# Patient Record
Sex: Female | Born: 1985 | Race: White | Hispanic: No | Marital: Married | Smoking: Former smoker
Health system: Southern US, Community
[De-identification: ages and names within clinical notes are randomized; demographics above are authoritative.]

## PROBLEM LIST (undated history)

## (undated) DIAGNOSIS — Z789 Other specified health status: Secondary | ICD-10-CM

## (undated) HISTORY — PX: WISDOM TOOTH EXTRACTION: SHX21

---

## 2017-06-13 LAB — OB RESULTS CONSOLE ABO/RH: RH Type: POSITIVE

## 2017-06-13 LAB — OB RESULTS CONSOLE RUBELLA ANTIBODY, IGM: RUBELLA: IMMUNE

## 2017-06-13 LAB — OB RESULTS CONSOLE RPR: RPR: NONREACTIVE

## 2017-06-13 LAB — OB RESULTS CONSOLE ANTIBODY SCREEN: Antibody Screen: NEGATIVE

## 2017-06-13 LAB — OB RESULTS CONSOLE HEPATITIS B SURFACE ANTIGEN: Hepatitis B Surface Ag: NEGATIVE

## 2017-06-13 LAB — OB RESULTS CONSOLE VARICELLA ZOSTER ANTIBODY, IGG: VARICELLA IGG: IMMUNE

## 2017-06-13 LAB — OB RESULTS CONSOLE HIV ANTIBODY (ROUTINE TESTING): HIV: NONREACTIVE

## 2017-06-24 LAB — CYTOLOGY - PAP: Pap: NEGATIVE

## 2017-06-24 LAB — OB RESULTS CONSOLE GC/CHLAMYDIA
CHLAMYDIA, DNA PROBE: NEGATIVE
GC PROBE AMP, GENITAL: NEGATIVE

## 2017-12-02 NOTE — L&D Delivery Note (Signed)
Delivery Note At 12:43 AM a viable female was delivered via  (Presentation: vertex; ROA) over an intact perineum. Head delivered ROA. No nuchal cord present. Shoulder and body delivered in usual fashion. Infant placed on mother's abdomen, dried and bulb suctioned. Cord clamped x 2 after 1-minute delay, and cut by family member. Cord blood drawn. After 1 minute, the cord was clamped and cut. 40 units of pitocin diluted in 1000cc LR was infused rapidly IV.  The placenta separated spontaneously and delivered via CCT and maternal pushing effort.  It was inspected and appears to be intact with a 3 VC. Uterine firmness confirmed after bimanual massage, IM pitocin and methergine.   APGAR: 8,9; weight pending.   Placenta status: intact, to L&D.  Cord: 3V without complications.  Cord pH: not sent.  Anesthesia:  n/a Episiotomy:  n/a Lacerations:  Superficial perineal Suture Repair: n/a Est. Blood Loss (mL): 300   Mom to postpartum.  Baby to Couplet care / Skin to Skin.  Haley Kline 12/31/2017, 1:07 AM

## 2017-12-12 ENCOUNTER — Encounter: Payer: Self-pay | Admitting: *Deleted

## 2017-12-23 ENCOUNTER — Ambulatory Visit (INDEPENDENT_AMBULATORY_CARE_PROVIDER_SITE_OTHER): Payer: No Typology Code available for payment source | Admitting: Student

## 2017-12-23 ENCOUNTER — Encounter (HOSPITAL_COMMUNITY): Payer: Self-pay

## 2017-12-23 ENCOUNTER — Ambulatory Visit (HOSPITAL_COMMUNITY)
Admission: RE | Admit: 2017-12-23 | Discharge: 2017-12-23 | Disposition: A | Discharge status: Home / Self Care | Payer: No Typology Code available for payment source | Source: Ambulatory Visit | Attending: Student | Admitting: Student

## 2017-12-23 ENCOUNTER — Other Ambulatory Visit: Payer: Self-pay | Admitting: Student

## 2017-12-23 ENCOUNTER — Other Ambulatory Visit (HOSPITAL_COMMUNITY)
Admission: RE | Admit: 2017-12-23 | Discharge: 2017-12-23 | Disposition: A | Discharge status: Home / Self Care | Payer: No Typology Code available for payment source | Source: Ambulatory Visit | Attending: Student | Admitting: Student

## 2017-12-23 ENCOUNTER — Encounter: Payer: Self-pay | Admitting: Student

## 2017-12-23 VITALS — BP 111/67 | HR 91 | Ht 66.0 in | Wt 218.6 lb

## 2017-12-23 DIAGNOSIS — Z3A39 39 weeks gestation of pregnancy: Secondary | ICD-10-CM | POA: Diagnosis not present

## 2017-12-23 DIAGNOSIS — L989 Disorder of the skin and subcutaneous tissue, unspecified: Secondary | ICD-10-CM | POA: Diagnosis not present

## 2017-12-23 DIAGNOSIS — Z3689 Encounter for other specified antenatal screening: Secondary | ICD-10-CM | POA: Diagnosis not present

## 2017-12-23 DIAGNOSIS — O09293 Supervision of pregnancy with other poor reproductive or obstetric history, third trimester: Secondary | ICD-10-CM

## 2017-12-23 DIAGNOSIS — Z3480 Encounter for supervision of other normal pregnancy, unspecified trimester: Secondary | ICD-10-CM

## 2017-12-23 DIAGNOSIS — O99213 Obesity complicating pregnancy, third trimester: Secondary | ICD-10-CM

## 2017-12-23 DIAGNOSIS — Z23 Encounter for immunization: Secondary | ICD-10-CM | POA: Diagnosis not present

## 2017-12-23 DIAGNOSIS — O0933 Supervision of pregnancy with insufficient antenatal care, third trimester: Secondary | ICD-10-CM | POA: Insufficient documentation

## 2017-12-23 DIAGNOSIS — Z3483 Encounter for supervision of other normal pregnancy, third trimester: Secondary | ICD-10-CM | POA: Insufficient documentation

## 2017-12-23 DIAGNOSIS — O26843 Uterine size-date discrepancy, third trimester: Secondary | ICD-10-CM

## 2017-12-23 DIAGNOSIS — E669 Obesity, unspecified: Secondary | ICD-10-CM | POA: Diagnosis not present

## 2017-12-23 HISTORY — DX: Other specified health status: Z78.9

## 2017-12-23 LAB — POCT URINALYSIS DIP (DEVICE)
BILIRUBIN URINE: NEGATIVE
Glucose, UA: NEGATIVE mg/dL
HGB URINE DIPSTICK: NEGATIVE
KETONES UR: NEGATIVE mg/dL
NITRITE: NEGATIVE
PH: 7 (ref 5.0–8.0)
PROTEIN: NEGATIVE mg/dL
Specific Gravity, Urine: 1.015 (ref 1.005–1.030)
Urobilinogen, UA: 0.2 mg/dL (ref 0.0–1.0)

## 2017-12-23 LAB — GLUCOSE, CAPILLARY: Glucose-Capillary: 63 mg/dL — ABNORMAL LOW (ref 65–99)

## 2017-12-23 LAB — OB RESULTS CONSOLE GBS: GBS: POSITIVE

## 2017-12-23 MED ORDER — MUPIROCIN CALCIUM 2 % EX CREA: 1.0000 "application " | TOPICAL_CREAM | Freq: Three times a day (TID) | CUTANEOUS | 0 refills | Status: DC

## 2017-12-23 NOTE — Patient Instructions (Addendum)
Contraception Choices Contraception, also called birth control, refers to methods or devices that prevent pregnancy. Hormonal methods Contraceptive implant A contraceptive implant is a thin, plastic tube that contains a hormone. It is inserted into the upper part of the arm. It can remain in place for up to 3 years. Progestin-only injections Progestin-only injections are injections of progestin, a synthetic form of the hormone progesterone. They are given every 3 months by a health care provider. Birth control pills Birth control pills are pills that contain hormones that prevent pregnancy. They must be taken once a day, preferably at the same time each day. Birth control patch The birth control patch contains hormones that prevent pregnancy. It is placed on the skin and must be changed once a week for three weeks and removed on the fourth week. A prescription is needed to use this method of contraception. Vaginal ring A vaginal ring contains hormones that prevent pregnancy. It is placed in the vagina for three weeks and removed on the fourth week. After that, the process is repeated with a new ring. A prescription is needed to use this method of contraception. Emergency contraceptive Emergency contraceptives prevent pregnancy after unprotected sex. They come in pill form and can be taken up to 5 days after sex. They work best the sooner they are taken after having sex. Most emergency contraceptives are available without a prescription. This method should not be used as your only form of birth control. Barrier methods Female condom A female condom is a thin sheath that is worn over the penis during sex. Condoms keep sperm from going inside a woman's body. They can be used with a spermicide to increase their effectiveness. They should be disposed after a single use. Female condom A female condom is a soft, loose-fitting sheath that is put into the vagina before sex. The condom keeps sperm from going  inside a woman's body. They should be disposed after a single use. Diaphragm A diaphragm is a soft, dome-shaped barrier. It is inserted into the vagina before sex, along with a spermicide. The diaphragm blocks sperm from entering the uterus, and the spermicide kills sperm. A diaphragm should be left in the vagina for 6-8 hours after sex and removed within 24 hours. A diaphragm is prescribed and fitted by a health care provider. A diaphragm should be replaced every 1-2 years, after giving birth, after gaining more than 15 lb (6.8 kg), and after pelvic surgery. Cervical cap A cervical cap is a round, soft latex or plastic cup that fits over the cervix. It is inserted into the vagina before sex, along with spermicide. It blocks sperm from entering the uterus. The cap should be left in place for 6-8 hours after sex and removed within 48 hours. A cervical cap must be prescribed and fitted by a health care provider. It should be replaced every 2 years. Sponge A sponge is a soft, circular piece of polyurethane foam with spermicide on it. The sponge helps block sperm from entering the uterus, and the spermicide kills sperm. To use it, you make it wet and then insert it into the vagina. It should be inserted before sex, left in for at least 6 hours after sex, and removed and thrown away within 30 hours. Spermicides Spermicides are chemicals that kill or block sperm from entering the cervix and uterus. They can come as a cream, jelly, suppository, foam, or tablet. A spermicide should be inserted into the vagina with an applicator at least 10-15 minutes before   sex to allow time for it to work. The process must be repeated every time you have sex. Spermicides do not require a prescription. Intrauterine contraception Intrauterine device (IUD) An IUD is a T-shaped device that is put in a woman's uterus. There are two types:  Hormone IUD.This type contains progestin, a synthetic form of the hormone progesterone. This  type can stay in place for 3-5 years.  Copper IUD.This type is wrapped in copper wire. It can stay in place for 10 years.  Permanent methods of contraception Female tubal ligation In this method, a woman's fallopian tubes are sealed, tied, or blocked during surgery to prevent eggs from traveling to the uterus. Hysteroscopic sterilization In this method, a small, flexible insert is placed into each fallopian tube. The inserts cause scar tissue to form in the fallopian tubes and block them, so sperm cannot reach an egg. The procedure takes about 3 months to be effective. Another form of birth control must be used during those 3 months. Female sterilization This is a procedure to tie off the tubes that carry sperm (vasectomy). After the procedure, the man can still ejaculate fluid (semen). Natural planning methods Natural family planning In this method, a couple does not have sex on days when the woman could become pregnant. Calendar method This means keeping track of the length of each menstrual cycle, identifying the days when pregnancy can happen, and not having sex on those days. Ovulation method In this method, a couple avoids sex during ovulation. Symptothermal method This method involves not having sex during ovulation. The woman typically checks for ovulation by watching changes in her temperature and in the consistency of cervical mucus. Post-ovulation method In this method, a couple waits to have sex until after ovulation. Summary  Contraception, also called birth control, means methods or devices that prevent pregnancy.  Hormonal methods of contraception include implants, injections, pills, patches, vaginal rings, and emergency contraceptives.  Barrier methods of contraception can include female condoms, female condoms, diaphragms, cervical caps, sponges, and spermicides.  There are two types of IUDs (intrauterine devices). An IUD can be put in a woman's uterus to prevent pregnancy  for 3-5 years.  Permanent sterilization can be done through a procedure for males, females, or both.  Natural family planning methods involve not having sex on days when the woman could become pregnant. This information is not intended to replace advice given to you by your health care provider. Make sure you discuss any questions you have with your health care provider. Document Released: 11/18/2005 Document Revised: 12/21/2016 Document Reviewed: 12/21/2016 Elsevier Interactive Patient Education  2018 [employer].     Perinatal Depression When a woman feels excessive sadness, anger, or anxiety during pregnancy or during the first 12 months after she gives birth, she has a condition called perinatal depression. Depression can interfere with work, school, relationships, and other everyday activities. If it is not managed properly, it can also cause problems in the mother and her baby. Sometimes, perinatal depression is left untreated because symptoms are thought to be normal mood swings during and right after pregnancy. If you have symptoms of depression, it is important to talk with your health care provider. What are the causes? The exact cause of this condition is not known. Hormonal changes during and after pregnancy may play a role in causing perinatal depression. What increases the risk? You are more likely to develop this condition if:  You have a personal or family history of depression, anxiety, or mood  disorders.  You experience a stressful life event during pregnancy, such as the death of a loved one.  You have a lot of regular life stress.  You do not have support from family members or loved ones, or you are in an abusive relationship.  What are the signs or symptoms? Symptoms of this condition include:  Feeling sad or hopeless.  Feelings of guilt.  Feeling irritable or overwhelmed.  Changes in your appetite.  Lack of energy or motivation.  Sleep  problems.  Difficulty concentrating or completing tasks.  Loss of interest in hobbies or relationships.  Headaches or stomach problems that do not go away.  How is this diagnosed? This condition is diagnosed based on a physical exam and mental evaluation. In some cases, your health care provider may use a depression screening tool. These tools include a list of questions that can help a health care provider diagnose depression. Your health care provider may refer you to a mental health expert who specializes in depression. How is this treated? This condition may be treated with:  Medicines. Your health care provider will only give you medicines that have been proven safe for pregnancy and breastfeeding.  Talk therapy with a mental health professional to help change your patterns of thinking (cognitive behavioral therapy).  Support groups.  Brain stimulation or light therapies.  Stress reduction therapies, such as mindfulness.  Follow these instructions at home: Lifestyle  Do not use any products that contain nicotine or tobacco, such as cigarettes and e-cigarettes. If you need help quitting, ask your health care provider.  Do not use alcohol when you are pregnant. After your baby is born, limit alcohol intake to no more than 1 drink a day. One drink equals 12 oz of beer, 5 oz of wine, or 1 oz of hard liquor.  Consider joining a support group for new mothers. Ask your health care provider for recommendations.  Take good care of yourself. Make sure you: ? Get plenty of sleep. If you are having trouble sleeping, talk with your health care provider. ? Eat a healthy diet. This includes plenty of fruits and vegetables, whole grains, and lean proteins. ? Exercise regularly, as told by your health care provider. Ask your health care provider what exercises are safe for you. General instructions  Take over-the-counter and prescription medicines only as told by your health care  provider.  Talk with your partner or family members about your feelings during pregnancy. Share any concerns or anxieties that you may have.  Ask for help with tasks or chores when you need it. Ask friends and family members to provide meals, watch your children, or help with cleaning.  Keep all follow-up visits as told by your health care provider. This is important. Contact a health care provider if:  You (or people close to you) notice that you have any symptoms of depression.  You have depression and your symptoms get worse.  You experience side effects from medicines, such as nausea or sleep problems. Get help right away if:  You feel like hurting yourself, your baby, or someone else. If you ever feel like you may hurt yourself or others, or have thoughts about taking your own life, get help right away. You can go to your nearest emergency department or call:  Your local emergency services (911 in the [place]  A suicide crisis helpline, such as the National Suicide Prevention Lifeline at 603-816-6962. This is open 24 hours a day.  Summary  Perinatal depression  is when a woman feels excessive sadness, anger, or anxiety during pregnancy or during the first 12 months after she gives birth.  If perinatal depression is not treated, it can lead to health problems for the mother and her baby.  This condition is treated with medicines, talk therapy, stress reduction therapies, or a combination of two or more treatments.  Talk with your partner or family members about your feelings. Do not be afraid to ask for help. This information is not intended to replace advice given to you by your health care provider. Make sure you discuss any questions you have with your health care provider. Document Released: 01/15/2017 Document Revised: 01/15/2017 Document Reviewed: 01/15/2017 Elsevier Interactive Patient Education  [employer]2018 Elsevier Inc.

## 2017-12-23 NOTE — Progress Notes (Signed)
Subjective:    Haley Kline is a [id number] 5765w0d being seen today for her first obstetrical visit.  Her obstetrical history is significant for limited prenatal care & hx of macrosomia.. Patient does intend to breast feed. Pregnancy history fully reviewed. [facility] was [facility] [place] [facility] but has not been seen since 07/2017. She moved to [place] last month. Denies complications with previous deliveries or pregnancies. Has history of macrosomia -- last baby weighed 9 lb 8oz -- delivered naturally. Patient reports mood swings & feeling depressed at time over the last few weeks. Denies history of diagnosed depression but does reports some "PP blues" after previous deliveries that did not require intervention or medication. Currently denies SI/HI. States she has good support at home with her husband & her mother who lives near by. Recommended Asher MuirJamie CSW to patient -- states she will consider talking to her in the future or if symptoms don't improve/worsen but does not want to speak with her today.   Patient reports no complaints.  Vitals:   12/23/17 0907 12/23/17 0908  BP: 111/67   Pulse: 91   Weight: 218 lb 9.6 oz (99.2 kg)   Height:  5\' 6"  (1.676 m)    HISTORY: OB History  Gravida Para Term Preterm AB Living  3 2 2     2   SAB TAB Ectopic Multiple Live Births          2    # Outcome Date GA Lbr Len/2nd Weight Sex Delivery Anes PTL Lv  3 Current           2 Term 04/28/16 7024w3d  9 lb 8 oz (4.309 kg) M Vag-Spont None N LIV  1 Term 10/14/14 5524w3d  8 lb 8 oz (3.856 kg) M  EPI N LIV     History reviewed. No pertinent past medical history. History reviewed. No pertinent surgical history. History reviewed. No pertinent family history.   Exam    Uterus:  Fundal Height: 41 cm  Pelvic Exam:    Vagina:  GC/CT collected   Cervix: 1.5/50/-2   Skin: Multiple lesions on bilateral inner thighs -- crusted over, erythematous surrounding scabs. No lesions.    Cardiovascular: regular rate and rhythm, no murmurs or gallops   Respiratory:  appears well, vitals normal, no respiratory distress, acyanotic, normal RR, chest clear, no wheezing, crepitations, rhonchi, normal symmetric air entry   Abdomen: soft, non-tender; bowel sounds normal; no masses,  no organomegaly      Assessment:    Pregnancy: [id number] Patient Active Problem List   Diagnosis Date Noted  . Supervision of other normal pregnancy, antepartum 12/23/2017  . Limited prenatal care in third trimester 12/23/2017  . Hx of macrosomia in infant in prior pregnancy, currently pregnant, third trimester 12/23/2017  . Uterine size date discrepancy pregnancy, third trimester 12/23/2017        Plan:   1. Supervision of other normal pregnancy, antepartum  - Culture, beta strep (group b only) - GC/Chlamydia probe amp ([place])not at [facility] - Flu Vaccine QUAD 36+ mos IM - Tdap vaccine greater than or equal to 7yo IM - RPR - CBC - HIV antibody (with reflex) - Culture, OB Urine  2. Limited prenatal care in third trimester - not fasting today for gtt -- random CBG = 63  3. Skin lesion  - mupirocin cream (BACTROBAN) 2 %; Apply 1 application topically 3 (three) times daily.  Dispense: 30 g; Refill: 0  4. Hx of macrosomia in  infant in prior pregnancy, currently pregnant, third trimester -Fundal height today 41 cm. U/s for growth scheduled for this afternoon - [place] MFM OB DETAIL +14 WK; Future  5. Uterine size date discrepancy pregnancy, third trimester  - [place] MFM OB DETAIL +14 WK; Future    Judeth Horn 12/23/2017

## 2017-12-24 ENCOUNTER — Telehealth: Payer: Self-pay | Admitting: General Practice

## 2017-12-24 LAB — CBC
Hematocrit: 38.3 % (ref 34.0–46.6)
Hemoglobin: 12.7 g/dL (ref 11.1–15.9)
MCH: 27.9 pg (ref 26.6–33.0)
MCHC: 33.2 g/dL (ref 31.5–35.7)
MCV: 84 fL (ref 79–97)
PLATELETS: 250 10*3/uL (ref 150–379)
RBC: 4.55 x10E6/uL (ref 3.77–5.28)
RDW: 13.6 % (ref 12.3–15.4)
WBC: 6.8 10*3/uL (ref 3.4–10.8)

## 2017-12-24 LAB — GC/CHLAMYDIA PROBE AMP (~~LOC~~) NOT AT ARMC
CHLAMYDIA, DNA PROBE: NEGATIVE
NEISSERIA GONORRHEA: NEGATIVE

## 2017-12-24 LAB — RPR: RPR: NONREACTIVE

## 2017-12-24 LAB — HIV ANTIBODY (ROUTINE TESTING W REFLEX): HIV SCREEN 4TH GENERATION: NONREACTIVE

## 2017-12-24 NOTE — Telephone Encounter (Signed)
Patient called and left message on nurse line stating she is returning a call for results. Per chart review, cannot see where call was placed. Called patient and she states maybe it was regarding the ultrasound because they were concerned about baby's growth. Reviewed ultrasound with patient. Told patient I will send Denny Peonrin a message as maybe she tried to call her. Patient verbalized understanding and had no questions

## 2017-12-26 LAB — CULTURE, BETA STREP (GROUP B ONLY): Strep Gp B Culture: POSITIVE — AB

## 2017-12-27 ENCOUNTER — Encounter: Payer: Self-pay | Admitting: Student

## 2017-12-27 DIAGNOSIS — B951 Streptococcus, group B, as the cause of diseases classified elsewhere: Secondary | ICD-10-CM | POA: Insufficient documentation

## 2017-12-29 LAB — URINE CULTURE, OB REFLEX

## 2017-12-29 LAB — CULTURE, OB URINE

## 2017-12-30 ENCOUNTER — Encounter (HOSPITAL_COMMUNITY): Payer: Self-pay

## 2017-12-30 ENCOUNTER — Other Ambulatory Visit: Payer: Self-pay

## 2017-12-30 ENCOUNTER — Other Ambulatory Visit: Payer: Self-pay | Admitting: Advanced Practice Midwife

## 2017-12-30 ENCOUNTER — Inpatient Hospital Stay (HOSPITAL_COMMUNITY)
Admission: AD | Admit: 2017-12-30 | Discharge: 2018-01-02 | DRG: 807 | Disposition: A | Discharge status: Home / Self Care | Payer: No Typology Code available for payment source | Source: Ambulatory Visit | Attending: Obstetrics and Gynecology | Admitting: Obstetrics and Gynecology

## 2017-12-30 ENCOUNTER — Telehealth (HOSPITAL_COMMUNITY): Payer: Self-pay | Admitting: *Deleted

## 2017-12-30 ENCOUNTER — Ambulatory Visit (INDEPENDENT_AMBULATORY_CARE_PROVIDER_SITE_OTHER): Payer: No Typology Code available for payment source | Admitting: Advanced Practice Midwife

## 2017-12-30 VITALS — BP 113/85 | HR 101 | Wt 219.5 lb

## 2017-12-30 DIAGNOSIS — O26843 Uterine size-date discrepancy, third trimester: Secondary | ICD-10-CM | POA: Diagnosis present

## 2017-12-30 DIAGNOSIS — B951 Streptococcus, group B, as the cause of diseases classified elsewhere: Secondary | ICD-10-CM | POA: Diagnosis present

## 2017-12-30 DIAGNOSIS — Z3A40 40 weeks gestation of pregnancy: Secondary | ICD-10-CM | POA: Diagnosis not present

## 2017-12-30 DIAGNOSIS — Z3483 Encounter for supervision of other normal pregnancy, third trimester: Secondary | ICD-10-CM | POA: Diagnosis present

## 2017-12-30 DIAGNOSIS — O99824 Streptococcus B carrier state complicating childbirth: Secondary | ICD-10-CM | POA: Diagnosis present

## 2017-12-30 DIAGNOSIS — Z87891 Personal history of nicotine dependence: Secondary | ICD-10-CM

## 2017-12-30 DIAGNOSIS — O3663X0 Maternal care for excessive fetal growth, third trimester, not applicable or unspecified: Secondary | ICD-10-CM

## 2017-12-30 DIAGNOSIS — O09293 Supervision of pregnancy with other poor reproductive or obstetric history, third trimester: Secondary | ICD-10-CM | POA: Diagnosis not present

## 2017-12-30 DIAGNOSIS — O3660X0 Maternal care for excessive fetal growth, unspecified trimester, not applicable or unspecified: Secondary | ICD-10-CM

## 2017-12-30 DIAGNOSIS — Z3480 Encounter for supervision of other normal pregnancy, unspecified trimester: Secondary | ICD-10-CM

## 2017-12-30 DIAGNOSIS — O0933 Supervision of pregnancy with insufficient antenatal care, third trimester: Secondary | ICD-10-CM

## 2017-12-30 MED ORDER — ONDANSETRON HCL 4 MG/2ML IJ SOLN: 4.0000 mg | Freq: Four times a day (QID) | INTRAMUSCULAR | Status: DC | PRN

## 2017-12-30 MED ORDER — PHENYLEPHRINE 40 MCG/ML (10ML) SYRINGE FOR IV PUSH (FOR BLOOD PRESSURE SUPPORT)
80.0000 ug | PREFILLED_SYRINGE | INTRAVENOUS | Status: DC | PRN
  Filled 2017-12-30: qty 5
  Filled 2017-12-30: qty 10

## 2017-12-30 MED ORDER — SOD CITRATE-CITRIC ACID 500-334 MG/5ML PO SOLN: 30.0000 mL | ORAL | Status: DC | PRN

## 2017-12-30 MED ORDER — EPHEDRINE 5 MG/ML INJ
10.0000 mg | INTRAVENOUS | Status: DC | PRN
  Filled 2017-12-30: qty 2

## 2017-12-30 MED ORDER — PENICILLIN G POTASSIUM 5000000 UNITS IJ SOLR
5.0000 10*6.[IU] | Freq: Once | INTRAMUSCULAR | Status: DC
  Filled 2017-12-30: qty 5

## 2017-12-30 MED ORDER — LIDOCAINE HCL (PF) 1 % IJ SOLN
30.0000 mL | INTRAMUSCULAR | Status: DC | PRN
  Filled 2017-12-30: qty 30

## 2017-12-30 MED ORDER — ACETAMINOPHEN 325 MG PO TABS: 650.0000 mg | ORAL_TABLET | ORAL | Status: DC | PRN

## 2017-12-30 MED ORDER — PHENYLEPHRINE 40 MCG/ML (10ML) SYRINGE FOR IV PUSH (FOR BLOOD PRESSURE SUPPORT)
80.0000 ug | PREFILLED_SYRINGE | INTRAVENOUS | Status: DC | PRN
  Filled 2017-12-30: qty 5

## 2017-12-30 MED ORDER — DIPHENHYDRAMINE HCL 50 MG/ML IJ SOLN: 12.5000 mg | INTRAMUSCULAR | Status: DC | PRN

## 2017-12-30 MED ORDER — OXYTOCIN BOLUS FROM INFUSION: 500.0000 mL | Freq: Once | INTRAVENOUS | Status: DC

## 2017-12-30 MED ORDER — LACTATED RINGERS IV SOLN: 500.0000 mL | Freq: Once | INTRAVENOUS | Status: DC

## 2017-12-30 MED ORDER — OXYCODONE-ACETAMINOPHEN 5-325 MG PO TABS: 2.0000 | ORAL_TABLET | ORAL | Status: DC | PRN

## 2017-12-30 MED ORDER — FENTANYL 2.5 MCG/ML BUPIVACAINE 1/10 % EPIDURAL INFUSION (WH - ANES)
14.0000 mL/h | INTRAMUSCULAR | Status: DC | PRN
  Filled 2017-12-30: qty 100

## 2017-12-30 MED ORDER — FLEET ENEMA 7-19 GM/118ML RE ENEM: 1.0000 | ENEMA | RECTAL | Status: DC | PRN

## 2017-12-30 MED ORDER — OXYTOCIN 40 UNITS IN LACTATED RINGERS INFUSION - SIMPLE MED
2.5000 [IU]/h | INTRAVENOUS | Status: DC
  Filled 2017-12-30: qty 1000

## 2017-12-30 MED ORDER — LACTATED RINGERS IV SOLN: 500.0000 mL | INTRAVENOUS | Status: DC | PRN

## 2017-12-30 MED ORDER — PENICILLIN G POT IN DEXTROSE 60000 UNIT/ML IV SOLN
3.0000 10*6.[IU] | INTRAVENOUS | Status: DC
  Filled 2017-12-30 (×3): qty 50

## 2017-12-30 MED ORDER — OXYCODONE-ACETAMINOPHEN 5-325 MG PO TABS: 1.0000 | ORAL_TABLET | ORAL | Status: DC | PRN

## 2017-12-30 MED ORDER — LACTATED RINGERS IV SOLN
INTRAVENOUS | Status: DC
  Administered 2017-12-31: 01:00:00 via INTRAVENOUS

## 2017-12-30 NOTE — H&P (Signed)
Haley Kline is a 32 y.o. G3P2 female at 5913w0d gestation presenting for SOL. EFW >90% on 12/23/17.   Patient has received limited prenatal care, last visit was 08/01/17. Recently moved to [place]. [facility]. Membrane sweep morning of 1/29. Denies any vaginal bleeding.   Hx of macrosomia with previous pregnancy. Baby was 9 lb 8 oz. Otherwise pregnancy has been uncomplicated.   Prenatal care: [facility] care at [facility]   OB History    Gravida Para Term Preterm AB Living   3 2 2     2    SAB TAB Ectopic Multiple Live Births           2     Past Medical History:  Diagnosis Date  . Medical history non-contributory    Past Surgical History:  Procedure Laterality Date  . WISDOM TOOTH EXTRACTION     Family History: family history is not on file. Social History:  reports that she quit smoking about 4 years ago. Her smoking use included cigars. she has never used smokeless tobacco. She reports that she does not drink alcohol or use drugs.     Maternal Diabetes: No Genetic Screening: Declined Maternal Ultrasounds/Referrals: Normal Fetal Ultrasounds or other Referrals:  None Maternal Substance Abuse:  No Significant Maternal Medications:  None Significant Maternal Lab Results:  None Other Comments:  None  Review of Systems  Constitutional: Negative for fever.  HENT: Negative.   Respiratory: Negative for shortness of breath.   Cardiovascular: Negative for chest pain.  Gastrointestinal: Positive for constipation and nausea. Negative for abdominal pain, diarrhea and vomiting.  Neurological: Negative for headaches.   Maternal Medical History:  Reason for admission: Nausea.     Dilation: 6 Effacement (%): 90 Station: +1 Exam by:: Zenia ResidesNikki Risheq, RN  Blood pressure 106/74, pulse 99, temperature (!) 97.5 F (36.4 C), temperature source Oral, resp. rate 18, weight 222 lb 12 oz (101 kg), last menstrual period 03/25/2017, SpO2  100 %. Exam Physical Exam  Nursing note and vitals reviewed. Constitutional: She is oriented to person, place, and time. She appears well-developed and well-nourished. No distress.  HENT:  Head: Normocephalic and atraumatic.  Neck: Normal range of motion.  GI: Soft. There is tenderness. There is no rebound.  Neurological: She is alert and oriented to person, place, and time.  Skin: Skin is warm and dry.    Prenatal labs: ABO, Rh: --/--/O POS (01/29 2357) Antibody: NEG (01/29 2357) Rubella: Immune (07/13 1435) RPR: Non Reactive (01/22 1018)  HBsAg: Negative (07/13 1435)  HIV: Non Reactive (01/22 1018)  GBS: Positive (01/22 0000)  GC/Chlamydia: negative (12/23/17) 1-hr glucola: 123 Genetic screening:  n/a Anatomy US: normal anatomy at 18 week  Assessment/Plan: 32 y.o. G3P2 female at 4213w0d gestation in SOL.   Labor: Expectant management  FWB: Cat I Pain: per patient request ID: GBS positive, no penicillin given  MOF: Breastfeeding MOC: undecided. Past two pregnancies with POP.  Circ: N/A, girl     Frederik PearJulie P Lakeva Hollon, MD 12/31/2017

## 2017-12-30 NOTE — MAU Note (Signed)
Pt presents with contraction stating they are 3.5 minutes apart lasting 45 seconds. She is rating the pain 8/10 intermittent.   Pt reports good fetal movement. Denies LOF

## 2017-12-30 NOTE — Telephone Encounter (Signed)
Preadmission screen  

## 2017-12-30 NOTE — Progress Notes (Signed)
   PRENATAL VISIT NOTE  Subjective:  Haley Kline is a 32 y.o. G3P2002 at 197w0d being seen today for ongoing prenatal care.  She is currently monitored for the following issues for this low-risk pregnancy and has Supervision of other normal pregnancy, antepartum; Limited prenatal care in third trimester; Hx of macrosomia in infant in prior pregnancy, currently pregnant, third trimester; Uterine size date discrepancy pregnancy, third trimester; and Positive GBS test on their problem list.  Patient reports backache.  Contractions: Regular. Vag. Bleeding: Bloody Show.  Movement: Present. Denies leaking of fluid.   The following portions of the patient's history were reviewed and updated as appropriate: allergies, current medications, past family history, past medical history, past social history, past surgical history and problem list. Problem list updated.  Objective:   Vitals:   12/30/17 1010  BP: 113/85  Pulse: (!) 101  Weight: 219 lb 8 oz (99.6 kg)    Fetal Status: Fetal Heart Rate (bpm): 155   Movement: Present  Presentation: Vertex  General:  Alert, oriented and cooperative. Patient is in no acute distress.  Skin: Skin is warm and dry. No rash noted.   Cardiovascular: Normal heart rate noted  Respiratory: Normal respiratory effort, no problems with respiration noted  Abdomen: Soft, gravid, appropriate for gestational age.  Pain/Pressure: Present     Pelvic: Cervical exam performed Dilation: 2 Effacement (%): 70 Station: 0 Membranes swept today.   Extremities: Normal range of motion.  Edema: Trace  Mental Status:  Normal mood and affect. Normal behavior. Normal judgment and thought content.   Assessment and Plan:  Pregnancy: G3P2002 at 2197w0d  1. Supervision of other normal pregnancy, antepartum   2. Hx of macrosomia in infant in prior pregnancy, currently pregnant, third trimester   3. Excessive fetal growth affecting management of pregnancy in third trimester, single or  unspecified fetus    Consult with Dr. Adrian BlackwaterStinson, schedule IOL for tomorrow 12/31/17 Spoke with Labor and delivery 0730 available. Orders placed.    Term labor symptoms and general obstetric precautions including but not limited to vaginal bleeding, contractions, leaking of fluid and fetal movement were reviewed in detail with the patient. Please refer to After Visit Summary for other counseling recommendations.  No Follow-up on file.   Thressa ShellerHeather Tyshia Fenter, CNM

## 2017-12-31 ENCOUNTER — Encounter (HOSPITAL_COMMUNITY): Payer: Self-pay

## 2017-12-31 ENCOUNTER — Other Ambulatory Visit: Payer: Self-pay

## 2017-12-31 ENCOUNTER — Inpatient Hospital Stay (HOSPITAL_COMMUNITY): Admission: RE | Admit: 2017-12-31 | Payer: No Typology Code available for payment source | Source: Ambulatory Visit

## 2017-12-31 DIAGNOSIS — O3660X0 Maternal care for excessive fetal growth, unspecified trimester, not applicable or unspecified: Secondary | ICD-10-CM

## 2017-12-31 DIAGNOSIS — O99824 Streptococcus B carrier state complicating childbirth: Secondary | ICD-10-CM

## 2017-12-31 DIAGNOSIS — Z3A40 40 weeks gestation of pregnancy: Secondary | ICD-10-CM

## 2017-12-31 LAB — CBC
HCT: 37.8 % (ref 36.0–46.0)
HEMATOCRIT: 38.1 % (ref 36.0–46.0)
HEMOGLOBIN: 12.9 g/dL (ref 12.0–15.0)
Hemoglobin: 13 g/dL (ref 12.0–15.0)
MCH: 28.4 pg (ref 26.0–34.0)
MCH: 28.8 pg (ref 26.0–34.0)
MCHC: 33.9 g/dL (ref 30.0–36.0)
MCHC: 34.4 g/dL (ref 30.0–36.0)
MCV: 83.6 fL (ref 78.0–100.0)
MCV: 83.9 fL (ref 78.0–100.0)
PLATELETS: 248 10*3/uL (ref 150–400)
Platelets: 252 10*3/uL (ref 150–400)
RBC: 4.52 MIL/uL (ref 3.87–5.11)
RBC: 4.54 MIL/uL (ref 3.87–5.11)
RDW: 13.2 % (ref 11.5–15.5)
RDW: 13.3 % (ref 11.5–15.5)
WBC: 14.9 10*3/uL — AB (ref 4.0–10.5)
WBC: 18.5 10*3/uL — AB (ref 4.0–10.5)

## 2017-12-31 LAB — TYPE AND SCREEN
ABO/RH(D): O POS
ANTIBODY SCREEN: NEGATIVE

## 2017-12-31 LAB — RPR: RPR: NONREACTIVE

## 2017-12-31 LAB — ABO/RH: ABO/RH(D): O POS

## 2017-12-31 MED ORDER — OXYCODONE-ACETAMINOPHEN 5-325 MG PO TABS: 1.0000 | ORAL_TABLET | ORAL | Status: DC | PRN

## 2017-12-31 MED ORDER — BENZOCAINE-MENTHOL 20-0.5 % EX AERO: 1.0000 "application " | INHALATION_SPRAY | CUTANEOUS | Status: DC | PRN

## 2017-12-31 MED ORDER — PRENATAL MULTIVITAMIN CH
1.0000 | ORAL_TABLET | Freq: Every day | ORAL | Status: DC
  Administered 2017-12-31 – 2018-01-01 (×2): 1 via ORAL
  Filled 2017-12-31 (×2): qty 1

## 2017-12-31 MED ORDER — COCONUT OIL OIL: 1.0000 "application " | TOPICAL_OIL | Status: DC | PRN

## 2017-12-31 MED ORDER — ZOLPIDEM TARTRATE 5 MG PO TABS: 5.0000 mg | ORAL_TABLET | Freq: Every evening | ORAL | Status: DC | PRN

## 2017-12-31 MED ORDER — DIPHENHYDRAMINE HCL 25 MG PO CAPS: 25.0000 mg | ORAL_CAPSULE | Freq: Four times a day (QID) | ORAL | Status: DC | PRN

## 2017-12-31 MED ORDER — DIBUCAINE 1 % RE OINT: 1.0000 "application " | TOPICAL_OINTMENT | RECTAL | Status: DC | PRN

## 2017-12-31 MED ORDER — SIMETHICONE 80 MG PO CHEW: 80.0000 mg | CHEWABLE_TABLET | ORAL | Status: DC | PRN

## 2017-12-31 MED ORDER — OXYTOCIN 10 UNIT/ML IJ SOLN
INTRAMUSCULAR | Status: AC
  Administered 2017-12-31: 10 [IU] via INTRAMUSCULAR
  Filled 2017-12-31: qty 1

## 2017-12-31 MED ORDER — OXYCODONE-ACETAMINOPHEN 5-325 MG PO TABS: 2.0000 | ORAL_TABLET | ORAL | Status: DC | PRN

## 2017-12-31 MED ORDER — ACETAMINOPHEN 325 MG PO TABS
650.0000 mg | ORAL_TABLET | ORAL | Status: DC | PRN
  Filled 2017-12-31: qty 2

## 2017-12-31 MED ORDER — TETANUS-DIPHTH-ACELL PERTUSSIS 5-2.5-18.5 LF-MCG/0.5 IM SUSP: 0.5000 mL | Freq: Once | INTRAMUSCULAR | Status: DC

## 2017-12-31 MED ORDER — FENTANYL CITRATE (PF) 100 MCG/2ML IJ SOLN: 100.0000 ug | Freq: Once | INTRAMUSCULAR | Status: DC

## 2017-12-31 MED ORDER — ONDANSETRON HCL 4 MG/2ML IJ SOLN: 4.0000 mg | INTRAMUSCULAR | Status: DC | PRN

## 2017-12-31 MED ORDER — FENTANYL CITRATE (PF) 100 MCG/2ML IJ SOLN
INTRAMUSCULAR | Status: AC
  Administered 2017-12-31: 100 ug via INTRAMUSCULAR
  Filled 2017-12-31: qty 2

## 2017-12-31 MED ORDER — WITCH HAZEL-GLYCERIN EX PADS: 1.0000 "application " | MEDICATED_PAD | CUTANEOUS | Status: DC | PRN

## 2017-12-31 MED ORDER — SENNOSIDES-DOCUSATE SODIUM 8.6-50 MG PO TABS
2.0000 | ORAL_TABLET | ORAL | Status: DC
  Administered 2018-01-01 – 2018-01-02 (×2): 2 via ORAL
  Filled 2017-12-31 (×3): qty 2

## 2017-12-31 MED ORDER — METHYLERGONOVINE MALEATE 0.2 MG/ML IJ SOLN
0.2000 mg | Freq: Once | INTRAMUSCULAR | Status: AC
  Administered 2017-12-31: 0.2 mg via INTRAMUSCULAR

## 2017-12-31 MED ORDER — ONDANSETRON HCL 4 MG PO TABS
4.0000 mg | ORAL_TABLET | ORAL | Status: DC | PRN
Start: 2017-12-31 — End: 2018-01-02

## 2017-12-31 MED ORDER — IBUPROFEN 600 MG PO TABS
600.0000 mg | ORAL_TABLET | Freq: Four times a day (QID) | ORAL | Status: DC
  Administered 2017-12-31 – 2018-01-02 (×8): 600 mg via ORAL
  Filled 2017-12-31 (×9): qty 1

## 2017-12-31 NOTE — Progress Notes (Signed)
CSW received consult due to score 14 on Edinburgh Depression Screen.    When CSW arrived MOB was bonding with infant as evidence by engaging in skin to skin.  MOB appeared comfortable and expressed how breastfeeding with infant was going well.  With MOB's permission, CSW asked MOB's guest to leave the room in effort to meet with MOB in private.   CSW reviewed MOB's response to the EDPS and MOB disclosed that MOB's last week of pregnancy was overwhelming.  MOB also reported that since giving birth, MOB feels better emotionally. MOB shared PPD hx with MOB's oldest child and communicated increase anxiety, isolation, and feeling alone.  MOB also shared that MOB feels that MOB's feeling after first pregnancy was exacerbated by MOB's child being fussy and FOB MH not being stable. MOB denied reaching out for help and reported since then FOB has received medications for his MH needs.   CSW provided education regarding Baby Blues vs PMADs and provided MOB with information about support groups held at [facility].  CSW encouraged MOB to evaluate her mental health throughout the postpartum period with the use of the New Mom Checklist developed by Postpartum Progress and notify a medical professional if symptoms arise. CSW assessed for safety and MOB denied SI, HI, and DV.  MOB presented with insight and awareness and is prepared to reach out to OB provider if a need arises.   CSW identifies no further need for intervention and no barriers to discharge at this time.  Yakima Kreitzer Boyd-Gilyard, MSW, LCSW Clinical Social Work [phone]  

## 2017-12-31 NOTE — Lactation Note (Signed)
This note was copied from a baby's chart. Lactation Consultation Note Baby 5 hrs old. Mom's 3rd baby. Mom BF her other 2 children 13 months each, denies difficulty. [facility]. Mom stated she was experienced and doing fine. Mom putting baby to breast in cradle position swaddled in 2 blankets. Suggested mom unwrap baby to get closer and suggested STS. Mom put the baby to the breast swaddled.  Told mom if she needed anything or had any questions to call. Noted mom writing down I&O for baby. Praised mom. [facility]/LC brochure given w/resources, support groups and LC services.  Patient Name: Haley Kline Today's Date: 12/31/2017 Reason for consult: Initial assessment   Maternal Data Has patient been taught Hand Expression?: Yes Does the patient have breastfeeding experience prior to this delivery?: Yes  Feeding Feeding Type: Breast Fed Length of feed: 15 min  LATCH Score Latch: Grasps breast easily, tongue down, lips flanged, rhythmical sucking.  Audible Swallowing: A few with stimulation  Type of Nipple: Everted at rest and after stimulation  Comfort (Breast/Nipple): Soft / non-tender  Hold (Positioning): No assistance needed to correctly position infant at breast.  LATCH Score: 9  Interventions Interventions: Breast feeding basics reviewed  Lactation Tools Discussed/Used     Consult Status Consult Status: PRN Date: 01/01/18 Follow-up type: In-patient    Charyl DancerCARVER, [place] G 12/31/2017, 6:12 AM

## 2018-01-01 LAB — BIRTH TISSUE RECOVERY COLLECTION (PLACENTA DONATION)

## 2018-01-01 NOTE — Progress Notes (Signed)
POSTPARTUM PROGRESS NOTE  Post Partum Day 1  Subjective:  Haley Kline is a 32 y.o. G3P3003 s/p SVD at 6937w1d.  No acute events overnight.  Pt denies problems with ambulating, voiding or po intake.  She denies nausea or vomiting.  Pain is well controlled.  She has had flatus. She has not had bowel movement.  Lochia Minimal.   Objective: Blood pressure 114/66, pulse 71, temperature (!) 97.5 F (36.4 C), temperature source Oral, resp. rate 16, height 5\' 6"  (1.676 m), weight 92.1 kg (203 lb), last menstrual period 03/25/2017, SpO2 98 %, unknown if currently breastfeeding.  Physical Exam:  General: alert, cooperative and no distress Chest: no respiratory distress Heart:regular rate, distal pulses intact Abdomen: soft, nontender,  Uterine Fundus: firm, appropriately tender DVT Evaluation: No calf swelling or tenderness Extremities: trace edema Skin: warm, dry  Recent Labs    12/30/17 2357 12/31/17 0523  HGB 12.9 13.0  HCT 38.1 37.8    Assessment/Plan: Haley Kline is a 32 y.o. G3P3003 s/p SVD at 4537w1d   PPD#1 - Doing well, continue expectant management Contraception: undecided Feeding: breast Dispo: Plan for discharge tomorrow.   LOS: 2 days   Alroy BailiffParker W Aalayah Riles MD 01/01/2018, 5:07 AM

## 2018-01-02 MED ORDER — IBUPROFEN 600 MG PO TABS: 600.0000 mg | ORAL_TABLET | Freq: Four times a day (QID) | ORAL | 0 refills | Status: AC

## 2018-01-02 MED ORDER — FERROUS SULFATE 325 (65 FE) MG PO TABS: 325.0000 mg | ORAL_TABLET | Freq: Every day | ORAL | Status: DC

## 2018-01-02 NOTE — Discharge Summary (Signed)
OB Discharge Summary     Patient Name: Haley HuhLaurie M Guse DOB: 05/31/1986 MRN: [id number]  Date of admission: 12/30/2017 Delivering MD: Frederik PearEGELE, JULIE P   Date of discharge: 01/02/2018  Admitting diagnosis: 40wks CTX 3.265mins  Intrauterine pregnancy: 2626w1d     Secondary diagnosis:  Principal Problem:   SVD (spontaneous vaginal delivery) Active Problems:   Supervision of other normal pregnancy, antepartum   Limited prenatal care in third trimester   Hx of macrosomia in infant in prior pregnancy, currently pregnant, third trimester   Uterine size date discrepancy pregnancy, third trimester   Positive GBS test   Normal labor   LGA (large for gestational age) fetus affecting management of mother     Discharge diagnosis: Term Pregnancy Delivered                                                                                                Post partum procedures:n/a  Augmentation: AROM  Complications: None  Hospital course:  Onset of Labor With Vaginal Delivery     32 y.o. yo G3P3003 at 1326w1d was admitted in Active Labor on 12/30/2017. Patient had an uncomplicated labor course as follows:  Membrane Rupture Time/Date: 12:26 AM ,12/31/2017   Intrapartum Procedures: Episiotomy: None [1]                                         Lacerations:  None [1]  Patient had a delivery of a Viable infant. 12/31/2017  Information for the patient's newborn:  Lieutenant Diegoaylor, Girl Quanta [[id number]][[id number]]  Delivery Method: Vaginal, Spontaneous(Filed from Delivery Summary)    Pateint had an uncomplicated postpartum course.  She is ambulating, tolerating a regular diet, passing flatus, and urinating well. Patient is discharged home in stable condition on 01/02/18.   Physical exam  Vitals:   12/31/17 1813 01/01/18 0300 01/01/18 0450 01/01/18 1811  BP: 118/67  114/66 114/68  Pulse: 70  71 69  Resp: 16  16 18   Temp: (!) 97.3 F (36.3 C)  (!) 97.5 F (36.4 C) 98.1 F (36.7 C)  TempSrc: Oral  Oral Oral  SpO2:       Weight:  92.1 kg (203 lb)    Height:       General: alert, cooperative and no distress Lochia: appropriate Uterine Fundus: firm Incision: N/A DVT Evaluation: No evidence of DVT seen on physical exam. Labs: Lab Results  Component Value Date   WBC 18.5 (H) 12/31/2017   HGB 13.0 12/31/2017   HCT 37.8 12/31/2017   MCV 83.6 12/31/2017   PLT 248 12/31/2017   No flowsheet data found.  Discharge instruction: per After Visit Summary and "Baby and Me Booklet".  After visit meds:  Allergies as of 01/02/2018      Reactions   Aloe Rash      Medication List    STOP taking these medications   PRENATAL VITAMINS PO     TAKE these medications   ferrous sulfate 325 (65 FE) MG tablet Take 325 mg by  mouth daily with breakfast.   ibuprofen 600 MG tablet Commonly known as:  ADVIL,MOTRIN Take 1 tablet (600 mg total) by mouth every 6 (six) hours.       Diet: routine diet  Activity: Advance as tolerated. Pelvic rest for 6 weeks.   Outpatient follow up:4 weeks Follow up Appt: Future Appointments  Date Time Provider Department Center  02/04/2018  9:15 AM Judeth Horn, NP [facility]   Follow up Visit:No Follow-up on file.  Postpartum contraception: Nexplanon  Newborn Data: Live born female  Birth Weight: 9 lb 15.3 oz (4516 g) APGAR: 8, 9  Newborn Delivery   Birth date/time:  12/31/2017 00:43:00 Delivery type:  Vaginal, Spontaneous     Baby Feeding: Breast Disposition:home with mother   01/02/2018 Alroy Bailiff, MD

## 2018-02-04 ENCOUNTER — Ambulatory Visit: Payer: No Typology Code available for payment source | Admitting: Student

## 2018-03-11 ENCOUNTER — Telehealth: Payer: Self-pay

## 2018-03-11 ENCOUNTER — Ambulatory Visit (INDEPENDENT_AMBULATORY_CARE_PROVIDER_SITE_OTHER): Payer: Medicaid Other | Admitting: Advanced Practice Midwife

## 2018-03-11 ENCOUNTER — Encounter: Payer: Self-pay | Admitting: Advanced Practice Midwife

## 2018-03-11 VITALS — BP 112/67 | HR 71 | Wt 188.3 lb

## 2018-03-11 DIAGNOSIS — Z1331 Encounter for screening for depression: Secondary | ICD-10-CM

## 2018-03-11 DIAGNOSIS — Z1389 Encounter for screening for other disorder: Secondary | ICD-10-CM

## 2018-03-11 DIAGNOSIS — Z3009 Encounter for other general counseling and advice on contraception: Secondary | ICD-10-CM

## 2018-03-11 NOTE — Patient Instructions (Addendum)

## 2018-03-11 NOTE — Progress Notes (Signed)
Subjective:     Haley Kline is a 32 y.o. female who presents for a postpartum visit. She is 10 weeks postpartum following a spontaneous vaginal delivery. I have fully reviewed the prenatal and intrapartum course. The delivery was at 40.1 gestational weeks. Outcome: spontaneous vaginal delivery. Anesthesia: none. Postpartum course has been Uncomplicated. Baby's course has been Uncomplicated. Baby is feeding by breast. Bleeding no bleeding. Had a menstrual period. Bowel function is normal. Bladder function is normal. Patient is sexually active and used a condom each time. Contraception method is planning Paragard IUD. Postpartum depression screening: positive. Has counseling appointment scheduled. Declines to see IBH today.   Last Pap 06/24/17, Nml.   The following portions of the patient's history were reviewed and updated as appropriate: allergies, current medications, past family history, past medical history, past social history, past surgical history and problem list.  Review of Systems Pertinent items are noted in HPI.   Objective:  BP 112/67   Pulse 71   Wt 188 lb 4.8 oz (85.4 kg)   LMP 02/18/2018 (Approximate)   Breastfeeding? Yes   BMI 30.39 kg/m  General:  alert, cooperative, appears stated age, no distress and mild distress   Breasts:  declined exam  Lungs: clear to auscultation bilaterally  Heart:  regular rate and rhythm, S1, S2 normal, no murmur, click, rub or gallop  Abdomen: soft, non-tender; bowel sounds normal; no masses,  no organomegaly   Vulva:  not evaluated  Vagina: not evaluated  Rectal Exam: Not performed.        Assessment:     Normal postpartum exam. Pap smear not done at today's visit.   Requesting Paragard IUD, but there is not one in stock.   Plan:    1. Contraception: condoms until Paragard is available.  2. Pap due in 2 years 3. Follow up in: as soon as Paragard arrives.  4. Paragard vs Liletta R/B/I discussed at length.   Katrinka BlazingSmith, [place],  CNM 03/11/2018 5:00 PM

## 2018-03-11 NOTE — Telephone Encounter (Signed)
LM for pt to return call to reschedule appt for IUD insertion.  Re: Pt needs to be informed that we have her Paragard so she just needs to schedule an appt.

## 2018-03-12 ENCOUNTER — Ambulatory Visit (INDEPENDENT_AMBULATORY_CARE_PROVIDER_SITE_OTHER): Payer: Medicaid Other | Admitting: Obstetrics and Gynecology

## 2018-03-12 VITALS — BP 111/71 | HR 83 | Wt 188.0 lb

## 2018-03-12 DIAGNOSIS — Z3202 Encounter for pregnancy test, result negative: Secondary | ICD-10-CM

## 2018-03-12 DIAGNOSIS — Z30430 Encounter for insertion of intrauterine contraceptive device: Secondary | ICD-10-CM

## 2018-03-12 LAB — POCT PREGNANCY, URINE: Preg Test, Ur: NEGATIVE

## 2018-03-12 MED ORDER — PARAGARD INTRAUTERINE COPPER IU IUD
1.0000 | INTRAUTERINE_SYSTEM | Freq: Once | INTRAUTERINE | Status: AC
  Administered 2018-03-12: 1 via INTRAUTERINE

## 2018-03-12 NOTE — Procedures (Signed)
Intrauterine Device (IUD) Insertion Procedure Note  After a recent pap (2018, NILM) was confirmed, written consent was obtained; her urine pregnancy test was: negative today. Last sex approx one week ago with a condom. The patient understands the risks of IUD placement, which include but are not limited to: bleeding, infection, uterine perforation, risk of expulsion, risk of failure < 1%, increased risk of ectopic pregnancy in the event of failure.   Prior to the procedure being performed, the patient (or guardian) was asked to state their full name, date of birth, and the type of procedure being performed. A bimanual exam showed the uterus to be midposition.  Next, the cervix and vagina were cleaned with an antiseptic solution, and the cervix was grasped with a tenaculum.  The uterus was sounded to 7.5 cm.  The ParaGard was placed without difficulty in the usual fashion.  The strings were cut to 3-4 cm.  The tenaculum was removed and cervix was found to be hemostatic.    No complications, patient tolerated the procedure well.  Cornelia Copaharlie Vineta Carone, Jr MD Attending Center for [facility] Midwife(Faculty Practice)

## 2018-03-17 ENCOUNTER — Encounter: Payer: Self-pay | Admitting: *Deleted

## 2018-04-14 NOTE — BH Specialist Note (Deleted)
Integrated Behavioral Health Initial Visit  MRN: [id number] Name: Haley Kline  Number of Integrated Behavioral Health Clinician visits:: 1/6 Session Start time: ***  Session End time: *** Total time: {IBH Total Time:21014050}  Type of Service: Integrated Behavioral Health- Individual/Family Interpretor:No. Interpretor Name and Language: n/a   Warm Hand Off Completed.       SUBJECTIVE: Haley Kline is a 32 y.o. female accompanied by {CHL AMB ACCOMPANIED WU:9811914782} Patient was referred by Dorathy Kinsman, CNM  for positive depression screen postpartum. Patient reports the following symptoms/concerns: *** Duration of problem: ***; Severity of problem: {Mild/Moderate/Severe:20260}  OBJECTIVE: Mood: {BHH MOOD:22306} and Affect: {BHH AFFECT:22307} Risk of harm to self or others: {CHL AMB BH Suicide Current Mental Status:21022748}  LIFE CONTEXT: Family and Social: *** School/Work: *** Self-Care: *** Life Changes: Recent childbirth  GOALS ADDRESSED: Patient will: 1. Reduce symptoms of: {IBH Symptoms:21014056} 2. Increase knowledge and/or ability of: {IBH Patient Tools:21014057}  3. Demonstrate ability to: {IBH Goals:21014053}  INTERVENTIONS: Interventions utilized: {IBH Interventions:21014054}  Standardized Assessments completed: {IBH Screening Tools:21014051}  ASSESSMENT: Patient currently experiencing ***.   Patient may benefit from ***.  PLAN: 1. Follow up with behavioral health clinician on : *** 2. Behavioral recommendations: *** 3. Referral(s): {IBH Referrals:21014055} 4. "From scale of 1-10, how likely are you to follow plan?": ***  Rae Lips, LCSW   Edinburgh Postnatal Depression Scale - 03/11/18 1057      Edinburgh Postnatal Depression Scale:  In the Past 7 Days   I have been able to laugh and see the funny side of things.  2    I have looked forward with enjoyment to things.  2    I have blamed myself unnecessarily when things went  wrong.  1    I have been anxious or worried for no good reason.  1    I have felt scared or panicky for no good reason.  0    Things have been getting on top of me.  2    I have been so unhappy that I have had difficulty sleeping.  1    I have felt sad or miserable.  2    I have been so unhappy that I have been crying.  1    The thought of harming myself has occurred to me.  2    Edinburgh Postnatal Depression Scale Total  14      Depression screen [facility] 2/9 03/11/2018 12/23/2017  Decreased Interest 2 1  Down, Depressed, Hopeless 3 3  PHQ - 2 Score 5 4  Altered sleeping 1 2  Tired, decreased energy 3 1  Change in appetite 1 1  Feeling bad or failure about yourself  2 0  Trouble concentrating 2 0  Moving slowly or fidgety/restless 1 0  Suicidal thoughts 1 0  PHQ-9 Score 16 8   GAD 7 : Generalized Anxiety Score 03/11/2018 12/23/2017  Nervous, Anxious, on Edge 0 2  Control/stop worrying 1 2  Worry too much - different things 0 1  Trouble relaxing 1 1  Restless 0 0  Easily annoyed or irritable 2 2  Afraid - awful might happen 0 1  Total GAD 7 Score 4 9

## 2018-04-15 ENCOUNTER — Encounter: Payer: Medicaid Other | Admitting: Advanced Practice Midwife

## 2018-04-15 ENCOUNTER — Encounter: Payer: Self-pay | Admitting: *Deleted

## 2018-04-15 NOTE — Progress Notes (Signed)
Haley Kline did not keep her scheduled appointment for follow up. Per discussion with [name]  does not need to be called, may reschedule if she calls.

## 2019-04-17 IMAGING — US US MFM OB DETAIL+14 WK
1 series · 12 of 28 positions shown · non-contrast
Comparison: none

[Series 1: us mfm ob detail+14 wk · 56 acquisitions, 12 frames shown]
[im 3/56]
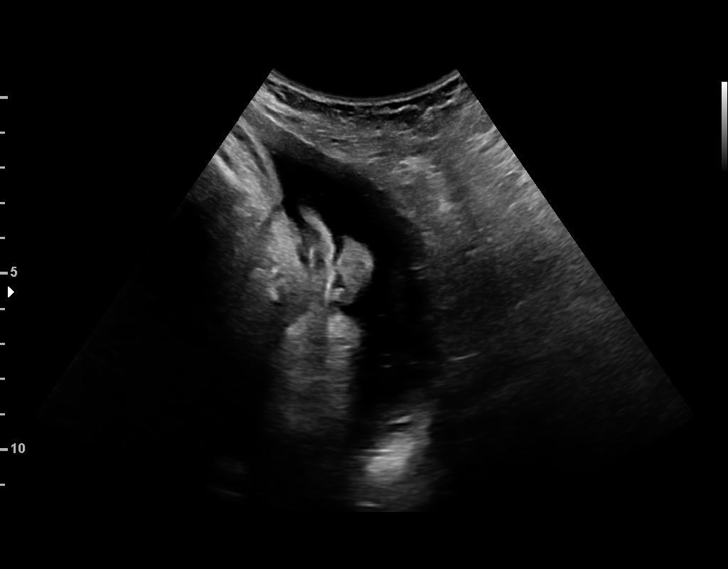
[im 7/56]
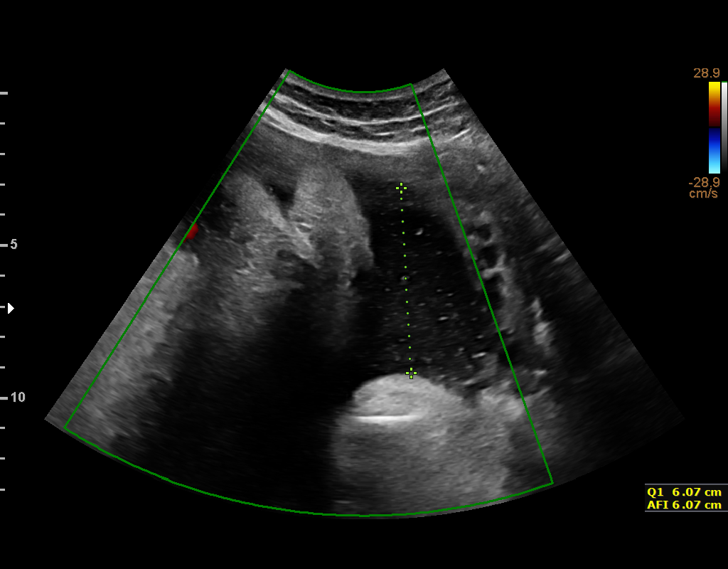
[im 11/56]
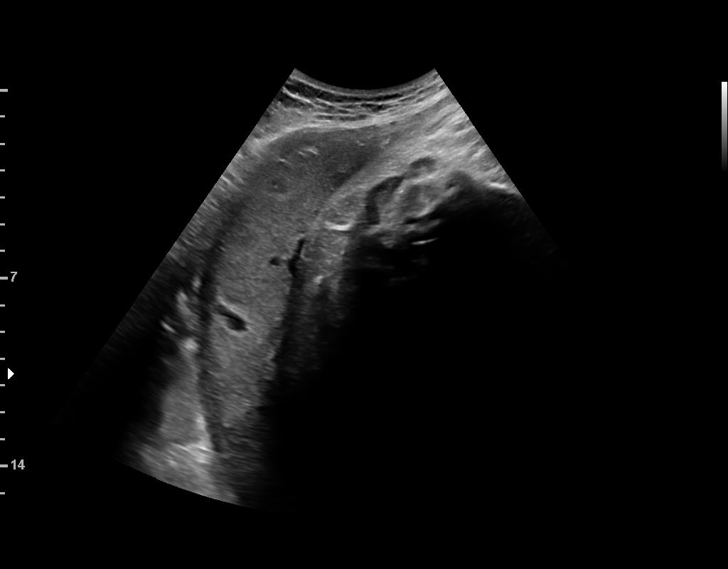
[im 17/56]
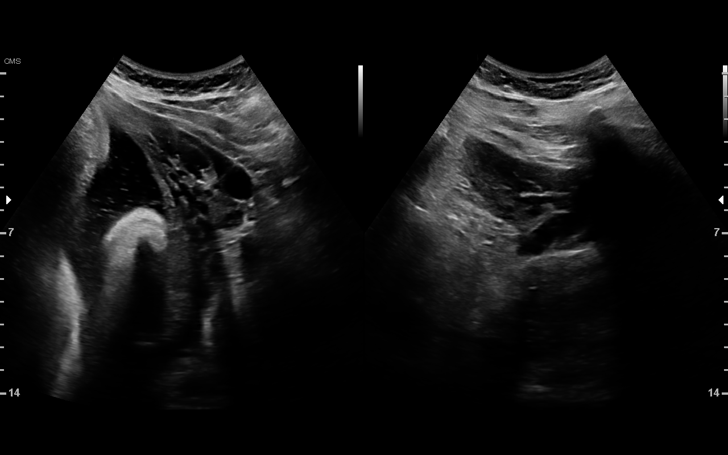
[im 21/56]
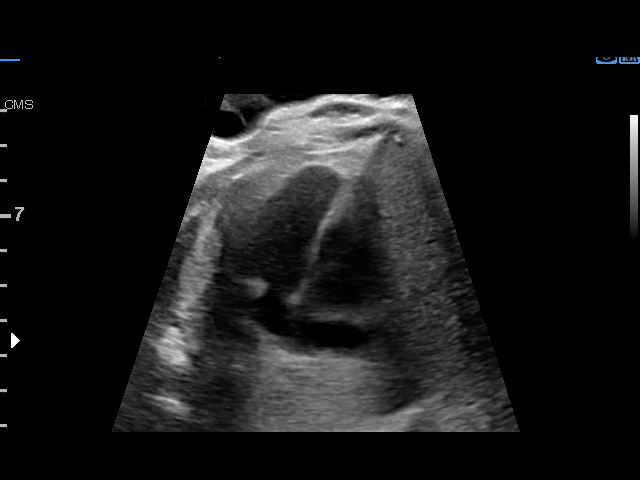
[im 25/56]
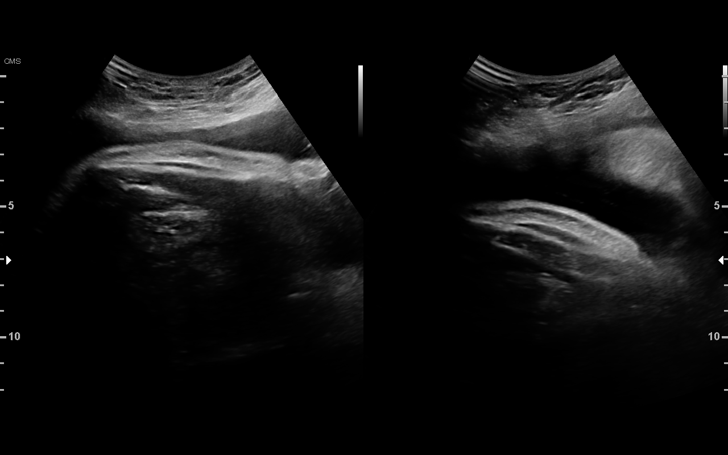
[im 31/56]
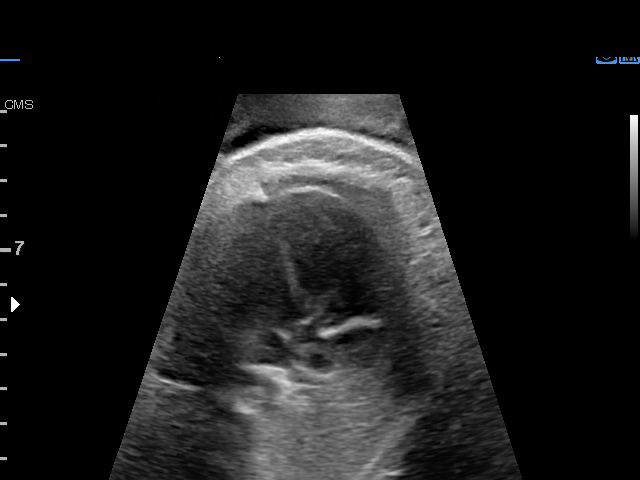
[im 35/56]
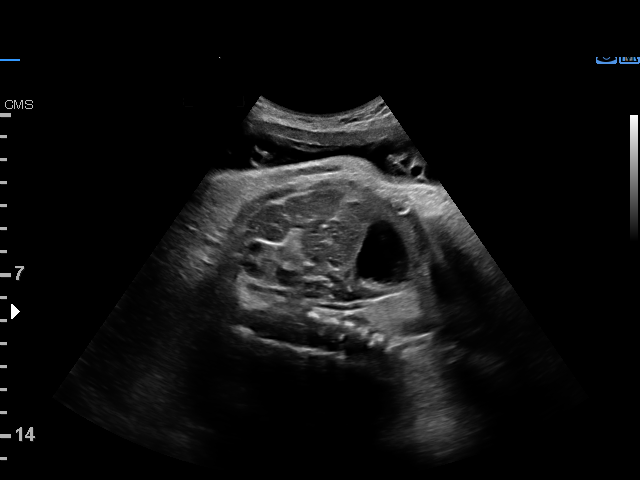
[im 39/56]
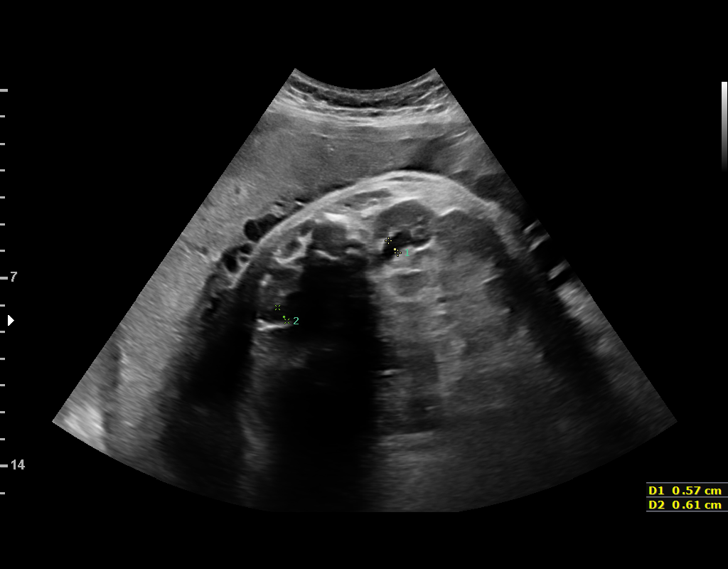
[im 45/56]
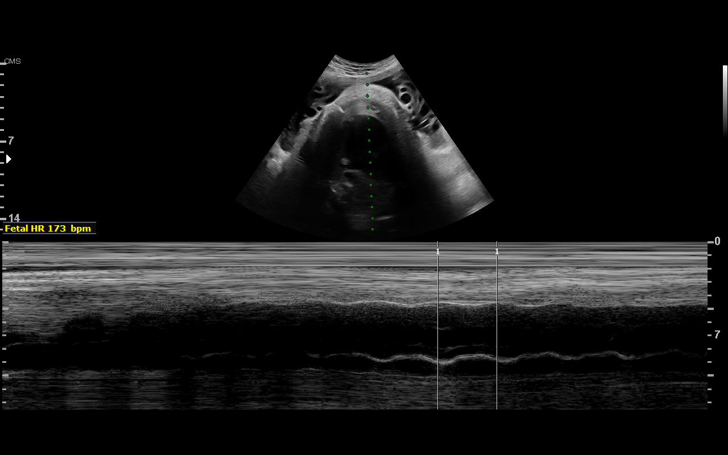
[im 49/56]
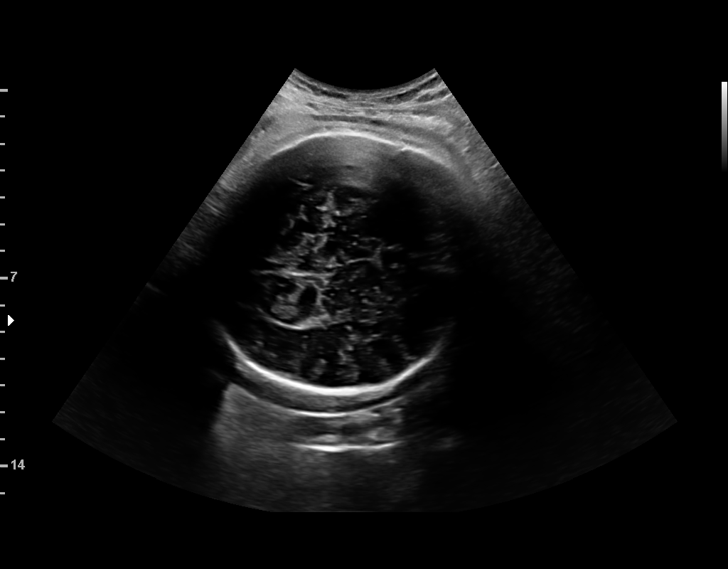
[im 53/56]
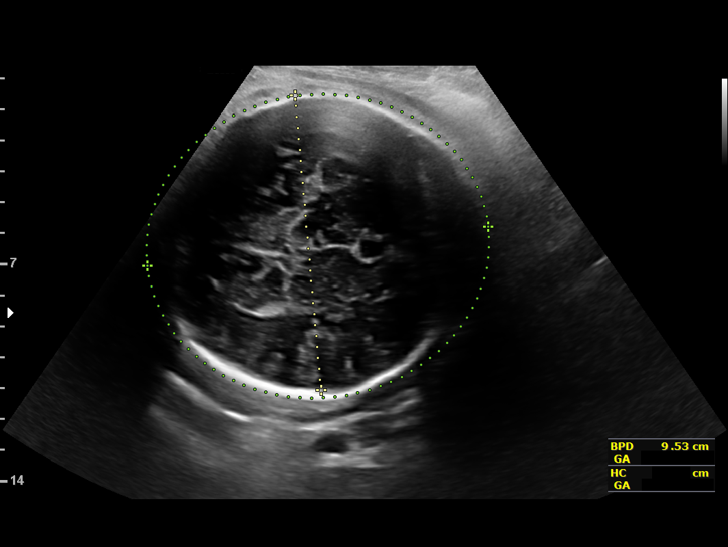

[12 of 28 positions shown; findings below may reference images not displayed]

OB/Gyn Clinic

Indications

39 weeks gestation of pregnancy
Encounter for fetal anatomic survey
Uterine size-date discrepancy, third trimester
Poor obstetric history: Prior fetal
macrosomia, antepartum
Insufficient Prenatal Care
OB History

Gravidity:    3         Term:   2        Prem:   0        SAB:   0
TOP:          0       Ectopic:  0        Living: 2
Fetal Evaluation

Num Of Fetuses:     1
Fetal Heart         173
Rate(bpm):
Cardiac Activity:   Observed
Presentation:       Cephalic
Placenta:           Posterior, above cervical os
P. Cord Insertion:  Not well visualized

Amniotic Fluid
AFI FV:      Subjectively within normal limits

AFI Sum(cm)     %Tile       Largest Pocket(cm)
21.14           88
RUQ(cm)       RLQ(cm)       LUQ(cm)        LLQ(cm)
6.07
Biometry

BPD:      95.5  mm     G. Age:  39w 0d         81  %    CI:        84.63   %    70 - 86
FL/HC:      20.7   %    20.6 -
HC:      327.1  mm     G. Age:  37w 1d          7  %    HC/AC:      0.85        0.87 -
AC:      383.4  mm     G. Age:  N/A          > 97  %    FL/BPD:     70.9   %    71 - 87
FL:       67.7  mm     G. Age:  34w 5d        < 3  %    FL/AC:      17.7   %    20 - 24
HUM:      61.3  mm     G. Age:  35w 3d          8  %

Est. FW:    2431  gm    8 lb 10 oz    > 90  %
Gestational Age

LMP:           39w 0d        Date:  03/25/17                 EDD:   12/30/17
U/S Today:     37w 0d                                        EDD:   01/13/18
Best:          39w 0d     Det. By:  LMP  (03/25/17)          EDD:   12/30/17
Anatomy

Cranium:               Appears normal         Aortic Arch:            Not well visualized
Cavum:                 Appears normal         Ductal Arch:            Not well visualized
Ventricles:            Appears normal         Diaphragm:              Appears normal
Choroid Plexus:        Appears normal         Stomach:                Appears normal, left
sided
Cerebellum:            Not well visualized    Abdomen:                Appears normal
Posterior Fossa:       Not well visualized    Abdominal Wall:         Not well visualized
Nuchal Fold:           Not applicable (>20    Cord Vessels:           Not well visualized
wks GA)
Face:                  Appears normal         Kidneys:                Appear normal
(orbits and profile)
Lips:                  Appears normal         Bladder:                Appears normal
Thoracic:              Appears normal         Spine:                  Not well visualized
Heart:                 Appears normal         Upper Extremities:      Not well visualized
(4CH, axis, and
situs)
RVOT:                  Not well visualized    Lower Extremities:      Visualized
LVOT:                  Appears normal

Other:  Technically difficult due to advanced gestational age.
Cervix Uterus Adnexa

Cervix
Not visualized (advanced GA >01wks)

Uterus
No abnormality visualized.

Left Ovary
Not visualized.

Right Ovary
Not visualized.

Cul De Sac:   No free fluid seen.

Adnexa:       No abnormality visualized.
Impression

Single living intrauterine pregnancy at 39w 0d.
Cephalic presentation.
Placenta Posterior, above cervical os.
Normal amniotic fluid volume.
Composite fetal growth in the > 90%ile. AC>97%ile.
Anatomy limited due to advanced gestational age.
No gross anomalies visualized.
Recommendations

Follow-up ultrasounds as clinically indicated.

## 2019-09-13 ENCOUNTER — Other Ambulatory Visit: Payer: Self-pay | Admitting: *Deleted

## 2019-09-13 DIAGNOSIS — Z20822 Contact with and (suspected) exposure to covid-19: Secondary | ICD-10-CM

## 2019-09-14 LAB — NOVEL CORONAVIRUS, NAA: SARS-CoV-2, NAA: NOT DETECTED

## 2019-11-29 ENCOUNTER — Ambulatory Visit: Payer: Medicaid Other | Attending: Internal Medicine

## 2019-11-29 DIAGNOSIS — Z20822 Contact with and (suspected) exposure to covid-19: Secondary | ICD-10-CM

## 2019-12-01 LAB — NOVEL CORONAVIRUS, NAA: SARS-CoV-2, NAA: NOT DETECTED
